# Patient Record
Sex: Male | Born: 2000 | Race: Black or African American | Hispanic: No | Marital: Single | Smoking: Never smoker
Health system: Southern US, Community
[De-identification: ages and names within clinical notes are randomized; demographics above are authoritative.]

## PROBLEM LIST (undated history)

## (undated) DIAGNOSIS — R011 Cardiac murmur, unspecified: Secondary | ICD-10-CM

## (undated) HISTORY — DX: Cardiac murmur, unspecified: R01.1

---
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
## 2023-07-26 ENCOUNTER — Ambulatory Visit
Admission: EM | Admit: 2023-07-26 | Discharge: 2023-07-26 | Disposition: A | Discharge status: Home / Self Care | Payer: Managed Care, Other (non HMO) | Attending: Internal Medicine | Admitting: Internal Medicine

## 2023-07-26 ENCOUNTER — Ambulatory Visit (INDEPENDENT_AMBULATORY_CARE_PROVIDER_SITE_OTHER): Payer: Managed Care, Other (non HMO)

## 2023-07-26 DIAGNOSIS — K5900 Constipation, unspecified: Secondary | ICD-10-CM | POA: Diagnosis not present

## 2023-07-26 MED ORDER — DOCUSATE SODIUM 100 MG PO CAPS: 100.0000 mg | ORAL_CAPSULE | Freq: Two times a day (BID) | ORAL | 0 refills | Status: AC | PRN

## 2023-07-26 NOTE — ED Triage Notes (Signed)
Pt presents with c/o lower lt back pain X 1 wk. Pt states he thinks he is constipated. States he has not been able to bend over at work.

## 2023-07-26 NOTE — ED Provider Notes (Signed)
[facility]    CSN: [id number] Arrival date & time: 07/26/23  1839      History   Chief Complaint Chief Complaint  Patient presents with   Back Pain    HPI Seth Evans is a 22 y.o. male presents for evaluation of left lower back pain.  Patient reports a month of intermittent but daily left lower back pain that occurs primarily after he eats and "digest" his food.  Reports history of constipation when he was in seventh grade and had to take MiraLAX for some time.  States he has not really had an issue with constipation until recently.  States his last bowel movement was yesterday and was hard and he had to do a lot of straining.  Reports a couple weeks of constipation concerns.  Denies any nausea or vomiting.  No dysuria.  Reports back pain is not tender to touch.  He states he had this back pain when he was a kid and he had constipation.  No injury to the back.  No numbness/tingling/weakness of his lower extremities, no bowel or bladder incontinence, and no saddle paresthesia.  He states he took MiraLAX x 1 last week without any change in symptoms.  Denies any change in diet and is eating and drinking normally.  No other concerns at this time.   Back Pain   Past Medical History:  Diagnosis Date   Heart murmur     There are no problems to display for this patient.   History reviewed. No pertinent surgical history.     Home Medications    Prior to Admission medications   Medication Sig Start Date End Date Taking? Authorizing Provider  docusate sodium (COLACE) 100 MG capsule Take 1 capsule (100 mg total) by mouth 2 (two) times daily as needed for mild constipation. 07/26/23  Yes Radford Pax, NP    Family History History reviewed. No pertinent family history.  Social History Social History   Tobacco Use   Smoking status: Never     Allergies   Patient has no known allergies.   Review of Systems Review of Systems  Gastrointestinal:  Positive for  constipation.  Musculoskeletal:  Positive for back pain.     Physical Exam Triage Vital Signs ED Triage Vitals  Encounter Vitals Group     BP 07/26/23 1848 124/60     Systolic BP Percentile --      Diastolic BP Percentile --      Pulse Rate 07/26/23 1847 65     Resp 07/26/23 1847 17     Temp 07/26/23 1847 98.8 F (37.1 C)     Temp Source 07/26/23 1847 Oral     SpO2 07/26/23 1847 99 %     Weight --      Height --      Head Circumference --      Peak Flow --      Pain Score 07/26/23 1846 6     Pain Loc --      Pain Education --      Exclude from Growth Chart --    No data found.  Updated Vital Signs BP 124/60   Pulse 65   Temp 98.8 F (37.1 C) (Oral)   Resp 17   SpO2 99%   Visual Acuity Right Eye Distance:   Left Eye Distance:   Bilateral Distance:    Right Eye Near:   Left Eye Near:    Bilateral Near:  Physical Exam Vitals and nursing note reviewed.  Constitutional:      General: He is not in acute distress.    Appearance: Normal appearance. He is not ill-appearing.  HENT:     Head: Normocephalic and atraumatic.  Eyes:     Pupils: Pupils are equal, round, and reactive to light.  Cardiovascular:     Rate and Rhythm: Normal rate.  Pulmonary:     Effort: Pulmonary effort is normal.  Abdominal:     General: Abdomen is flat. Bowel sounds are normal.     Palpations: There is no hepatomegaly or splenomegaly.     Tenderness: There is no abdominal tenderness. Negative signs include Prien's sign and Rovsing's sign.  Musculoskeletal:     Thoracic back: Normal.     Lumbar back: No swelling, edema, deformity, signs of trauma, lacerations, spasms, tenderness or bony tenderness. Normal range of motion. Negative right straight leg raise test and negative left straight leg raise test. No scoliosis.       Back:     Comments: Patient points to left lower back as site of pain but it is nontender to palpation.  Skin:    General: Skin is warm and dry.   Neurological:     General: No focal deficit present.     Mental Status: He is alert and oriented to person, place, and time.  Psychiatric:        Mood and Affect: Mood normal.        Behavior: Behavior normal.      UC Treatments / Results  Labs (all labs ordered are listed, but only abnormal results are displayed) Labs Reviewed - No data to display  EKG   Radiology DG Abd 1 View  Result Date: 07/26/2023 CLINICAL DATA:  Constipation EXAM: ABDOMEN - 1 VIEW COMPARISON:  None Available. FINDINGS: The bowel gas pattern is normal. Stool is seen within the colon although no findings to suggest abnormal fecal retention. No radio-opaque calculi or other significant radiographic abnormality are seen. Osseous structures are within normal limits. IMPRESSION: Nonobstructive bowel gas pattern.  No abnormal colonic stool burden. Electronically Signed   By: Duanne Guess D.O.   On: 07/26/2023 19:18    Procedures Procedures (including critical care time)  Medications Ordered in UC Medications - No data to display  Initial Impression / Assessment and Plan / UC Course  I have reviewed the triage vital signs and the nursing notes.  Pertinent labs & imaging results that were available during my care of the patient were reviewed by me and considered in my medical decision making (see chart for details).     Reviewed exam and symptoms with patient.  There are no red flags.  He has no tenderness palpation to his lower back on exam.  X-ray shows no abnormal fecal retention.  Discussed likely mild constipation.  Patient does not think that MiraLAX works for him any longer as he took it so much as a kid.  Will do trial ducosate.  Discussed increasing fluids and fiber.  Also recommended he can do an over-the-counter enema if he feels it is needed.  Advised PCP or GI follow-up if his symptoms do not improve.  ER precautions reviewed and patient verbalized understanding. Final Clinical Impressions(s) /  UC Diagnoses   Final diagnoses:  Constipation, unspecified constipation type     Discharge Instructions      Trial of ducosate twice daily as needed for constipation.  Lots of fluids and increase your fiber.  Please follow-up with the PCP if your symptoms are not improving.  Please go to the emergency room if you develop any worsening symptoms.  I hope you feel better soon!     ED Prescriptions     Medication Sig Dispense Auth. Provider   docusate sodium (COLACE) 100 MG capsule Take 1 capsule (100 mg total) by mouth 2 (two) times daily as needed for mild constipation. 15 capsule Radford Pax, NP      PDMP not reviewed this encounter.   Radford Pax, NP 07/26/23 [phone]

## 2023-07-26 NOTE — Discharge Instructions (Signed)
Trial of ducosate twice daily as needed for constipation.  Lots of fluids and increase your fiber.  Please follow-up with the PCP if your symptoms are not improving.  Please go to the emergency room if you develop any worsening symptoms.  I hope you feel better soon!

## 2024-01-02 ENCOUNTER — Ambulatory Visit
Admission: RE | Admit: 2024-01-02 | Discharge: 2024-01-02 | Disposition: A | Discharge status: Home / Self Care | Payer: Managed Care, Other (non HMO) | Source: Ambulatory Visit | Attending: Emergency Medicine | Admitting: Emergency Medicine

## 2024-01-02 VITALS — BP 125/69 | HR 51 | Temp 98.6°F | Resp 17

## 2024-01-02 DIAGNOSIS — S299XXA Unspecified injury of thorax, initial encounter: Secondary | ICD-10-CM | POA: Diagnosis not present

## 2024-01-02 MED ORDER — IBUPROFEN 600 MG PO TABS: 600.0000 mg | ORAL_TABLET | Freq: Four times a day (QID) | ORAL | 0 refills | Status: DC | PRN

## 2024-01-02 MED ORDER — IBUPROFEN 600 MG PO TABS: 600.0000 mg | ORAL_TABLET | Freq: Four times a day (QID) | ORAL | 0 refills | Status: AC | PRN

## 2024-01-02 NOTE — Discharge Instructions (Signed)
I recommend to try ibuprofen every 6 hours if needed for inflammation Apply ice (not directly on the skin) for 20 minutes at a time, several times daily Allow 4-5 days for symptoms to resolve If you have continued symptoms or feel worse, please return for xray evaluation

## 2024-01-02 NOTE — ED Provider Notes (Signed)
[facility]    CSN: [id number] Arrival date & time: 01/02/24  1128      History   Chief Complaint Chief Complaint  Patient presents with   Chest Injury    Playing basketball and got hit in the chest I been experiencing Heart pain for 4 days now and the middle of my chest feels like pain  On and off . - Entered by patient    HPI Seth Evans is a 23 y.o. male.  4 days ago playing basketball and someone's head hit him in the central chest. That night was having pain. No pain at rest or during the day. The next night had some pain and was worried about a heart attack Not currently having pain No issue with breathing No pain with deep breath Has not attempted intervention   Past Medical History:  Diagnosis Date   Heart murmur     There are no active problems to display for this patient.   History reviewed. No pertinent surgical history.     Home Medications    Prior to Admission medications   Medication Sig Start Date End Date Taking? Authorizing Provider  docusate sodium (COLACE) 100 MG capsule Take 1 capsule (100 mg total) by mouth 2 (two) times daily as needed for mild constipation. 07/26/23   Radford Pax, NP  ibuprofen (ADVIL) 600 MG tablet Take 1 tablet (600 mg total) by mouth every 6 (six) hours as needed. 01/02/24   Khalee Mazo, [place]    Family History History reviewed. No pertinent family history.  Social History Social History   Tobacco Use   Smoking status: Never     Allergies   Patient has no known allergies.   Review of Systems Review of Systems Per HPI  Physical Exam Triage Vital Signs ED Triage Vitals  Encounter Vitals Group     BP 01/02/24 1146 (!) 110/53     Systolic BP Percentile --      Diastolic BP Percentile --      Pulse Rate 01/02/24 1146 (!) 51     Resp 01/02/24 1145 17     Temp 01/02/24 1145 98.6 F (37 C)     Temp Source 01/02/24 1145 Oral     SpO2 01/02/24 1145 98 %     Weight --      Height --       Head Circumference --      Peak Flow --      Pain Score 01/02/24 1144 0     Pain Loc --      Pain Education --      Exclude from Growth Chart --    No data found.  Updated Vital Signs BP 125/69   Pulse (!) 51   Temp 98.6 F (37 C) (Oral)   Resp 17   SpO2 98%   Visual Acuity Right Eye Distance:   Left Eye Distance:   Bilateral Distance:    Right Eye Near:   Left Eye Near:    Bilateral Near:     HR 60 on my exam  Physical Exam Vitals and nursing note reviewed.  Constitutional:      General: He is not in acute distress.    Appearance: Normal appearance.  Cardiovascular:     Rate and Rhythm: Normal rate and regular rhythm.     Pulses: Normal pulses.     Heart sounds: Normal heart sounds.  Pulmonary:     Effort: Pulmonary effort is normal.  Breath sounds: Normal breath sounds.     Comments: Lungs are clear Chest:     Comments: Mildly tender central sternum. No bruising or deformity noted. Abdominal:     Palpations: Abdomen is soft.     Tenderness: There is no abdominal tenderness.  Musculoskeletal:        General: Normal range of motion.     Cervical back: Normal range of motion.  Skin:    General: Skin is warm and dry.  Neurological:     Mental Status: He is alert and oriented to person, place, and time.      UC Treatments / Results  Labs (all labs ordered are listed, but only abnormal results are displayed) Labs Reviewed - No data to display  EKG   Radiology No results found.  Procedures Procedures (including critical care time)  Medications Ordered in UC Medications - No data to display  Initial Impression / Assessment and Plan / UC Course  I have reviewed the triage vital signs and the nursing notes.  Pertinent labs & imaging results that were available during my care of the patient were reviewed by me and considered in my medical decision making (see chart for details).  Patient without symptoms at this time. Mild chest injury Shared  decision making defer chest xray Discussed cartilage vs muscular injury. Try ice and ibuprofen at home if needed. Can return if symptoms return. Patient agreeable to plan  Final Clinical Impressions(s) / UC Diagnoses   Final diagnoses:  Chest injury, initial encounter     Discharge Instructions      I recommend to try ibuprofen every 6 hours if needed for inflammation Apply ice (not directly on the skin) for 20 minutes at a time, several times daily Allow 4-5 days for symptoms to resolve If you have continued symptoms or feel worse, please return for xray evaluation      ED Prescriptions     Medication Sig Dispense Auth. Provider   ibuprofen (ADVIL) 600 MG tablet  (Status: Discontinued) Take 1 tablet (600 mg total) by mouth every 6 (six) hours as needed. 30 tablet Joycelin Radloff, PA-C   ibuprofen (ADVIL) 600 MG tablet Take 1 tablet (600 mg total) by mouth every 6 (six) hours as needed. 30 tablet Tashawna Thom, Lurena Joiner, PA-C      PDMP not reviewed this encounter.   Marlow Baars, [place] 01/02/24 1247

## 2024-01-02 NOTE — ED Triage Notes (Signed)
Pt reports being hit in the chest while playing basketball. Reports sharp chest pain that comes and goes.

## 2024-01-15 ENCOUNTER — Ambulatory Visit
Admission: EM | Admit: 2024-01-15 | Discharge: 2024-01-15 | Disposition: A | Discharge status: Home / Self Care | Payer: Managed Care, Other (non HMO) | Attending: Physician Assistant | Admitting: Physician Assistant

## 2024-01-15 DIAGNOSIS — Z113 Encounter for screening for infections with a predominantly sexual mode of transmission: Secondary | ICD-10-CM

## 2024-01-15 NOTE — Discharge Instructions (Addendum)
Lab results available in 2 - 4 days via [employer]

## 2024-01-15 NOTE — ED Triage Notes (Signed)
Pt presents to UC for std check. Denies symptoms. Reports he has "ingrown hair bumps." Denies known exposure.

## 2024-01-15 NOTE — ED Provider Notes (Signed)
[facility]    CSN: [id number] Arrival date & time: 01/15/24  1414      History   Chief Complaint No chief complaint on file.   HPI Seth Evans is a 23 y.o. male.   Patient here c/w STD testing.  No exposures, no sx.      Past Medical History:  Diagnosis Date   Heart murmur     There are no active problems to display for this patient.   History reviewed. No pertinent surgical history.     Home Medications    Prior to Admission medications   Medication Sig Start Date End Date Taking? Authorizing Provider  docusate sodium (COLACE) 100 MG capsule Take 1 capsule (100 mg total) by mouth 2 (two) times daily as needed for mild constipation. 07/26/23   Radford Pax, NP  ibuprofen (ADVIL) 600 MG tablet Take 1 tablet (600 mg total) by mouth every 6 (six) hours as needed. 01/02/24   Rising, Ray Church    Family History History reviewed. No pertinent family history.  Social History Social History   Tobacco Use   Smoking status: Never     Allergies   Patient has no known allergies.   Review of Systems Review of Systems  Constitutional:  Negative for chills, fatigue and fever.  Gastrointestinal:  Negative for abdominal pain, nausea and vomiting.  Genitourinary:  Negative for difficulty urinating, dysuria, flank pain, frequency, genital sores, hematuria, penile discharge, penile pain, scrotal swelling, testicular pain and urgency.  Musculoskeletal:  Negative for back pain.  Skin:  Negative for rash and wound.  Allergic/Immunologic: Negative for environmental allergies and immunocompromised state.  Neurological:  Negative for headaches.  Hematological:  Negative for adenopathy. Does not bruise/bleed easily.  Psychiatric/Behavioral:  Negative for sleep disturbance.      Physical Exam Triage Vital Signs ED Triage Vitals  Encounter Vitals Group     BP 01/15/24 1523 122/62     Systolic BP Percentile --      Diastolic BP Percentile --      Pulse  Rate 01/15/24 1523 (!) 56     Resp 01/15/24 1523 16     Temp 01/15/24 1523 98.5 F (36.9 C)     Temp Source 01/15/24 1523 Oral     SpO2 01/15/24 1523 99 %     Weight --      Height --      Head Circumference --      Peak Flow --      Pain Score 01/15/24 1521 0     Pain Loc --      Pain Education --      Exclude from Growth Chart --    No data found.  Updated Vital Signs BP 122/62 (BP Location: Right Arm)   Pulse (!) 56   Temp 98.5 F (36.9 C) (Oral)   Resp 16   SpO2 99%   Visual Acuity Right Eye Distance:   Left Eye Distance:   Bilateral Distance:    Right Eye Near:   Left Eye Near:    Bilateral Near:     Physical Exam Vitals and nursing note reviewed.  Constitutional:      Appearance: Normal appearance. He is well-developed.  HENT:     Head: Normocephalic and atraumatic.     Nose: Nose normal.     Mouth/Throat:     Mouth: Mucous membranes are moist.  Eyes:     General: No scleral icterus.    Extraocular Movements:  Extraocular movements intact.     Conjunctiva/sclera: Conjunctivae normal.  Pulmonary:     Effort: Pulmonary effort is normal. No respiratory distress.  Musculoskeletal:     Cervical back: Normal range of motion and neck supple. No rigidity.  Skin:    General: Skin is warm and dry.  Neurological:     General: No focal deficit present.     Mental Status: He is alert and oriented to person, place, and time.     Motor: No weakness.     Gait: Gait normal.  Psychiatric:        Mood and Affect: Mood normal.      UC Treatments / Results  Labs (all labs ordered are listed, but only abnormal results are displayed) Labs Reviewed  CYTOLOGY, (ORAL, ANAL, URETHRAL) ANCILLARY ONLY    EKG   Radiology No results found.  Procedures Procedures (including critical care time)  Medications Ordered in UC Medications - No data to display  Initial Impression / Assessment and Plan / UC Course  I have reviewed the triage vital signs and the nursing  notes.  Pertinent labs & imaging results that were available during my care of the patient were reviewed by me and considered in my medical decision making (see chart for details).     Patient declined HIV/RPR testing Results available via mychart in 2 - 4 days Final Clinical Impressions(s) / UC Diagnoses   Final diagnoses:  Screen for STD (sexually transmitted disease)     Discharge Instructions      Lab results available in 2 - 4 days via mychart   ED Prescriptions   None    PDMP not reviewed this encounter.   Evern Core, PA-C 01/15/24 1535

## 2024-01-16 LAB — CYTOLOGY, (ORAL, ANAL, URETHRAL) ANCILLARY ONLY
Chlamydia: NEGATIVE
Comment: NEGATIVE
Comment: NEGATIVE
Comment: NORMAL
Neisseria Gonorrhea: NEGATIVE
Trichomonas: NEGATIVE
